# Patient Record
Sex: Female | Born: 2000 | Race: White | Hispanic: No | Marital: Single | Smoking: Never smoker
Health system: Southern US, Community
[De-identification: ages and names within clinical notes are randomized; demographics above are authoritative.]

## PROBLEM LIST (undated history)

## (undated) HISTORY — PX: TONSILLECTOMY: SUR1361

## (undated) HISTORY — PX: WISDOM TOOTH EXTRACTION: SHX21

---
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
## 2020-02-23 ENCOUNTER — Telehealth: Payer: Self-pay

## 2020-02-23 NOTE — Telephone Encounter (Signed)
Patient called because she went back to school without her pills and missed a whole week of pills. She waited for two weeks for her next period to start and when It did she took the first pill of a new pack on the first day she bled. That was a week ago and now x 3 days she has seen bleeding off and on.  She is now sexually active but was not the week she missed pills and I recommended she use back up birth control this pack and take pill every day/same time every day.  I reassured her that this is like starting over and starting pill for first time and the most common side affect is BTB.  I reassured her but told her I would check with her backup MD and see what they recommended.

## 2020-02-27 NOTE — Telephone Encounter (Signed)
Spoke with patient and informed her. °

## 2020-02-27 NOTE — Telephone Encounter (Signed)
Yes, I completely agree with what you told her. Thanks!

## 2020-07-05 ENCOUNTER — Ambulatory Visit: Payer: 59 | Admitting: Nurse Practitioner

## 2020-07-05 ENCOUNTER — Other Ambulatory Visit: Payer: Self-pay

## 2020-07-05 ENCOUNTER — Encounter: Payer: 59 | Admitting: Nurse Practitioner

## 2020-07-05 ENCOUNTER — Encounter: Payer: Self-pay | Admitting: Nurse Practitioner

## 2020-07-05 VITALS — BP 124/76 | Ht 66.0 in | Wt 216.0 lb

## 2020-07-05 DIAGNOSIS — Z01419 Encounter for gynecological examination (general) (routine) without abnormal findings: Secondary | ICD-10-CM | POA: Diagnosis not present

## 2020-07-05 DIAGNOSIS — Z3041 Encounter for surveillance of contraceptive pills: Secondary | ICD-10-CM | POA: Diagnosis not present

## 2020-07-05 MED ORDER — NORETHIN ACE-ETH ESTRAD-FE 1-20 MG-MCG PO TABS: 1.0000 | ORAL_TABLET | Freq: Every day | ORAL | 3 refills | Status: DC

## 2020-07-05 NOTE — Patient Instructions (Signed)
Health Maintenance, Female Adopting a healthy lifestyle and getting preventive care are important in promoting health and wellness. Ask your health care provider about:  The right schedule for you to have regular tests and exams.  Things you can do on your own to prevent diseases and keep yourself healthy. What should I know about diet, weight, and exercise? Eat a healthy diet   Eat a diet that includes plenty of vegetables, fruits, low-fat dairy products, and lean protein.  Do not eat a lot of foods that are high in solid fats, added sugars, or sodium. Maintain a healthy weight Body mass index (BMI) is used to identify weight problems. It estimates body fat based on height and weight. Your health care provider can help determine your BMI and help you achieve or maintain a healthy weight. Get regular exercise Get regular exercise. This is one of the most important things you can do for your health. Most adults should:  Exercise for at least 150 minutes each week. The exercise should increase your heart rate and make you sweat (moderate-intensity exercise).  Do strengthening exercises at least twice a week. This is in addition to the moderate-intensity exercise.  Spend less time sitting. Even light physical activity can be beneficial. Watch cholesterol and blood lipids Have your blood tested for lipids and cholesterol at 19 years of age, then have this test every 5 years. Have your cholesterol levels checked more often if:  Your lipid or cholesterol levels are high.  You are older than 19 years of age.  You are at high risk for heart disease. What should I know about cancer screening? Depending on your health history and family history, you may need to have cancer screening at various ages. This may include screening for:  Breast cancer.  Cervical cancer.  Colorectal cancer.  Skin cancer.  Lung cancer. What should I know about heart disease, diabetes, and high blood  pressure? Blood pressure and heart disease  High blood pressure causes heart disease and increases the risk of stroke. This is more likely to develop in people who have high blood pressure readings, are of African descent, or are overweight.  Have your blood pressure checked: ? Every 3-5 years if you are 19-39 years of age. ? Every year if you are 40 years old or older. Diabetes Have regular diabetes screenings. This checks your fasting blood sugar level. Have the screening done:  Once every three years after age 40 if you are at a normal weight and have a low risk for diabetes.  More often and at a younger age if you are overweight or have a high risk for diabetes. What should I know about preventing infection? Hepatitis B If you have a higher risk for hepatitis B, you should be screened for this virus. Talk with your health care provider to find out if you are at risk for hepatitis B infection. Hepatitis C Testing is recommended for:  Everyone born from 1945 through 1965.  Anyone with known risk factors for hepatitis C. Sexually transmitted infections (STIs)  Get screened for STIs, including gonorrhea and chlamydia, if: ? You are sexually active and are younger than 19 years of age. ? You are older than 19 years of age and your health care provider tells you that you are at risk for this type of infection. ? Your sexual activity has changed since you were last screened, and you are at increased risk for chlamydia or gonorrhea. Ask your health care provider if   you are at risk.  Ask your health care provider about whether you are at high risk for HIV. Your health care provider may recommend a prescription medicine to help prevent HIV infection. If you choose to take medicine to prevent HIV, you should first get tested for HIV. You should then be tested every 3 months for as long as you are taking the medicine. Pregnancy  If you are about to stop having your period (premenopausal) and  you may become pregnant, seek counseling before you get pregnant.  Take 400 to 800 micrograms (mcg) of folic acid every day if you become pregnant.  Ask for birth control (contraception) if you want to prevent pregnancy. Osteoporosis and menopause Osteoporosis is a disease in which the bones lose minerals and strength with aging. This can result in bone fractures. If you are 65 years old or older, or if you are at risk for osteoporosis and fractures, ask your health care provider if you should:  Be screened for bone loss.  Take a calcium or vitamin D supplement to lower your risk of fractures.  Be given hormone replacement therapy (HRT) to treat symptoms of menopause. Follow these instructions at home: Lifestyle  Do not use any products that contain nicotine or tobacco, such as cigarettes, e-cigarettes, and chewing tobacco. If you need help quitting, ask your health care provider.  Do not use street drugs.  Do not share needles.  Ask your health care provider for help if you need support or information about quitting drugs. Alcohol use  Do not drink alcohol if: ? Your health care provider tells you not to drink. ? You are pregnant, may be pregnant, or are planning to become pregnant.  If you drink alcohol: ? Limit how much you use to 0-1 drink a day. ? Limit intake if you are breastfeeding.  Be aware of how much alcohol is in your drink. In the [place] one drink equals one 12 oz bottle of beer (355 mL), one 5 oz glass of wine (148 mL), or one 1 oz glass of hard liquor (44 mL). General instructions  Schedule regular health, dental, and eye exams.  Stay current with your vaccines.  Tell your health care provider if: ? You often feel depressed. ? You have ever been abused or do not feel safe at home. Summary  Adopting a healthy lifestyle and getting preventive care are important in promoting health and wellness.  Follow your health care provider's instructions about healthy  diet, exercising, and getting tested or screened for diseases.  Follow your health care provider's instructions on monitoring your cholesterol and blood pressure. This information is not intended to replace advice given to you by your health care provider. Make sure you discuss any questions you have with your health care provider. Document Revised: 11/27/2018 Document Reviewed: 11/27/2018 Elsevier Patient Education  2020 [employer]  

## 2020-07-05 NOTE — Progress Notes (Signed)
   Janetta Clune 04/02/2001 [id number]   History:  19 y.o. G0 presents for annual exam without GYN complaints. Regular monthly cycle on Loestrin. Sexually active with one female partner, denies need for STD screen. Unsure if she has had the Gardasil series.   Gynecologic History Patient's last menstrual period was 06/14/2020. Period Cycle (Days): 28 Period Duration (Days): 5 Period Pattern: Regular Menstrual Flow: Moderate Dysmenorrhea: None Contraception: OCP (estrogen/progesterone)   Past medical history, past surgical history, family history and social history were all reviewed and documented in the EPIC chart.  ROS:  A ROS was performed and pertinent positives and negatives are included.  Exam:  Vitals:   07/05/20 1109  BP: 124/76  Weight: 216 lb (98 kg)  Height: 5\' 6"  (1.676 m)   Body mass index is 34.86 kg/m.  General appearance:  Normal Thyroid:  Symmetrical, normal in size, without palpable masses or nodularity. Respiratory  Auscultation:  Clear without wheezing or rhonchi Cardiovascular  Auscultation:  Regular rate, without rubs, murmurs or gallops  Edema/varicosities:  Not grossly evident Abdominal  Soft,nontender, without masses, guarding or rebound.  Liver/spleen:  No organomegaly noted  Hernia:  None appreciated  Skin  Inspection:  Grossly normal   Breasts: Examined lying and sitting.   Right: Without masses, retractions, discharge or axillary adenopathy.   Left: Without masses, retractions, discharge or axillary adenopathy. Gentitourinary   Inguinal/mons:  Normal without inguinal adenopathy  External genitalia:  Normal  BUS/Urethra/Skene's glands:  Normal  Vagina:  Normal  Cervix:  Normal  Uterus:  Anteverted, normal in size, shape and contour.  Midline and mobile  Adnexa/parametria:     Rt: Without masses or tenderness.   Lt: Without masses or tenderness.  Anus and perineum: Normal  Assessment/Plan:  19 y.o. G0 for annual exam.   Well female  exam with routine gynecological exam - Education provided on SBEs, importance of preventative screenings, current guidelines, high calcium diet, regular exercise, campus safety, safe sex and condom use until permanent partner,  and multivitamin daily.   Encounter for surveillance of contraceptive pills - Plan: norethindrone-ethinyl estradiol (LOESTRIN FE) 1-20 MG-MCG tablet. Refill x1 year provided.  Follow up in 1 year for annual      12 [facility], 11:25 AM 07/05/2020

## 2020-12-22 ENCOUNTER — Other Ambulatory Visit: Payer: 59

## 2021-05-02 ENCOUNTER — Other Ambulatory Visit: Payer: Self-pay | Admitting: Nurse Practitioner

## 2021-05-02 DIAGNOSIS — Z3041 Encounter for surveillance of contraceptive pills: Secondary | ICD-10-CM

## 2021-05-03 NOTE — Telephone Encounter (Signed)
Annual exam scheduled on 07/07/21

## 2021-07-06 NOTE — Progress Notes (Signed)
   Casey Lane March 08, 2001 [id number]   History:  20 y.o. G0 presents for annual exam without GYN complaints. Regular monthly cycle on OCPs. She has had one dose of Gardasil in 2013 and is interested in full vaccination. Not currently sexually active.  Gynecologic History Patient's last menstrual period was 06/29/2021. Period Cycle (Days): 28 Period Duration (Days): 5 Period Pattern: Regular Menstrual Flow: Moderate Dysmenorrhea: None Contraception: OCP (estrogen/progesterone)   Past medical history, past surgical history, family history and social history were all reviewed and documented in the EPIC chart. In nursing school. Graduates in 2024.   ROS:  A ROS was performed and pertinent positives and negatives are included.  Exam:  Vitals:   07/07/21 0957  BP: 116/76  Weight: 215 lb (97.5 kg)  Height: 5\' 6"  (1.676 m)    Body mass index is 34.7 kg/m.  General appearance:  Normal Thyroid:  Symmetrical, normal in size, without palpable masses or nodularity. Respiratory  Auscultation:  Clear without wheezing or rhonchi Cardiovascular  Auscultation:  Regular rate, without rubs, murmurs or gallops  Edema/varicosities:  Not grossly evident Abdominal  Soft,nontender, without masses, guarding or rebound.  Liver/spleen:  No organomegaly noted  Hernia:  None appreciated  Skin  Inspection:  Grossly normal   Breasts: Not indicated Gentitourinary Not indicated  Assessment/Plan:  20 y.o. G0 for annual exam.   Well female exam without gynecological exam - Education provided on SBEs, importance of preventative screenings, current guidelines, high calcium diet, regular exercise, and multivitamin daily.   Encounter for surveillance of contraceptive pills - Plan: norethindrone-ethinyl estradiol-FE (JUNEL FE 1/20) 1-20 MG-MCG tablet daily. Taking as prescribed. Refill x 1 year provided.  Need for HPV vaccination - Plan: HPV 9-valent vaccine,Recombinat today. Will return in 2 months for  second dose. 3-dose series recommended.    Follow up in 1 year for annual      2/20 [facility], 11:01 AM 07/07/2021

## 2021-07-07 ENCOUNTER — Ambulatory Visit (INDEPENDENT_AMBULATORY_CARE_PROVIDER_SITE_OTHER): Payer: 59 | Admitting: Nurse Practitioner

## 2021-07-07 ENCOUNTER — Other Ambulatory Visit: Payer: Self-pay

## 2021-07-07 ENCOUNTER — Encounter: Payer: Self-pay | Admitting: Nurse Practitioner

## 2021-07-07 VITALS — BP 116/76 | Ht 66.0 in | Wt 215.0 lb

## 2021-07-07 DIAGNOSIS — Z23 Encounter for immunization: Secondary | ICD-10-CM

## 2021-07-07 DIAGNOSIS — Z0000 Encounter for general adult medical examination without abnormal findings: Secondary | ICD-10-CM | POA: Diagnosis not present

## 2021-07-07 DIAGNOSIS — Z3041 Encounter for surveillance of contraceptive pills: Secondary | ICD-10-CM | POA: Diagnosis not present

## 2021-07-07 MED ORDER — NORETHIN ACE-ETH ESTRAD-FE 1-20 MG-MCG PO TABS: 1.0000 | ORAL_TABLET | Freq: Every day | ORAL | 3 refills | Status: DC

## 2022-05-26 ENCOUNTER — Other Ambulatory Visit (HOSPITAL_COMMUNITY)
Admission: RE | Admit: 2022-05-26 | Discharge: 2022-05-26 | Disposition: A | Discharge status: Home / Self Care | Payer: 59 | Source: Ambulatory Visit | Attending: Nurse Practitioner | Admitting: Nurse Practitioner

## 2022-05-26 ENCOUNTER — Encounter: Payer: Self-pay | Admitting: Nurse Practitioner

## 2022-05-26 ENCOUNTER — Ambulatory Visit (INDEPENDENT_AMBULATORY_CARE_PROVIDER_SITE_OTHER): Payer: 59 | Admitting: Nurse Practitioner

## 2022-05-26 VITALS — BP 118/76 | Ht 65.0 in | Wt 237.0 lb

## 2022-05-26 DIAGNOSIS — Z113 Encounter for screening for infections with a predominantly sexual mode of transmission: Secondary | ICD-10-CM | POA: Diagnosis present

## 2022-05-26 DIAGNOSIS — Z01419 Encounter for gynecological examination (general) (routine) without abnormal findings: Secondary | ICD-10-CM | POA: Insufficient documentation

## 2022-05-26 DIAGNOSIS — Z3041 Encounter for surveillance of contraceptive pills: Secondary | ICD-10-CM

## 2022-05-26 MED ORDER — NORETHIN ACE-ETH ESTRAD-FE 1-20 MG-MCG PO TABS: 1.0000 | ORAL_TABLET | Freq: Every day | ORAL | 3 refills | Status: DC

## 2022-05-26 NOTE — Progress Notes (Signed)
   Casey Lane 2001-09-15 [id number]   History:  21 y.o. G0 presents for annual exam without GYN complaints. Regular monthly cycle on OCPs. First dose of Gardasil in 2013, received second last year.   Gynecologic History Patient's last menstrual period was 05/07/2022. Period Cycle (Days): 28 Period Duration (Days): 5 Period Pattern: Regular Menstrual Flow: Moderate Dysmenorrhea: None Contraception/Family planning: OCP (estrogen/progesterone) Sexually active: Yes  Health Maintenance Last Pap: Never Last mammogram: Not indicated Last colonoscopy: Not indicated Last Dexa: Not indicated   Past medical history, past surgical history, family history and social history were all reviewed and documented in the EPIC chart. 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   ROS:  A ROS was performed and pertinent positives and negatives are included.  Exam:  Vitals:   05/26/22 1414  BP: 118/76  Weight: 237 lb (107.5 kg)  Height: 5\' 5"  (1.651 m)     Body mass index is 39.44 kg/m.  General appearance:  Normal Thyroid:  Symmetrical, normal in size, without palpable masses or nodularity. Respiratory  Auscultation:  Clear without wheezing or rhonchi Cardiovascular  Auscultation:  Regular rate, without rubs, murmurs or gallops  Edema/varicosities:  Not grossly evident Abdominal  Soft,nontender, without masses, guarding or rebound.  Liver/spleen:  No organomegaly noted  Hernia:  None appreciated  Skin  Inspection:  Grossly normal Breasts: Not indicated per guidelines Genitourinary   Inguinal/mons:  Normal without inguinal adenopathy  External genitalia:  Normal appearing vulva with no masses, tenderness, or lesions  BUS/Urethra/Skene's glands:  Normal  Vagina:  Normal appearing with normal color and discharge, no lesions  Cervix:  Normal appearing without discharge or lesions  Uterus:  Normal in size, shape and contour.  Midline  and mobile, nontender  Adnexa/parametria:     Rt: Normal in size, without masses or tenderness.   Lt: Normal in size, without masses or tenderness.  Anus and perineum: Normal  Digital rectal exam: Not indicated  Patient informed chaperone available to be present for breast and pelvic exam. Patient has requested no chaperone to be present. Patient has been advised what will be completed during breast and pelvic exam.   Assessment/Plan:  21 y.o. G0 for annual exam.   Well female exam with routine gynecological exam - Plan: Cytology - PAP( [facility]). Education provided on SBEs, importance of preventative screenings, current guidelines, high calcium diet, regular exercise, and multivitamin daily.   Encounter for surveillance of contraceptive pills - Plan: norethindrone-ethinyl estradiol-FE (JUNEL FE 1/20) 1-20 MG-MCG tablet daily. Taking as prescribed. Refill x 1 year provided.   Screen for STD (sexually transmitted disease) - Plan: Cytology - PAP( [facility]), RPR, HIV Antibody (routine testing w rflx). GC, chlamydia, trich added to pap.   Screening for cervical cancer - Initial pap today.   Follow up in 1 year for annual.      2/20 [facility], 2:41 PM 05/26/2022

## 2022-05-29 LAB — RPR: RPR Ser Ql: NONREACTIVE

## 2022-05-29 LAB — HIV ANTIBODY (ROUTINE TESTING W REFLEX): HIV 1&2 Ab, 4th Generation: NONREACTIVE

## 2022-05-30 LAB — CYTOLOGY - PAP
Adequacy: ABSENT
Chlamydia: NEGATIVE
Comment: NEGATIVE
Comment: NEGATIVE
Comment: NORMAL
Diagnosis: UNDETERMINED — AB
Neisseria Gonorrhea: NEGATIVE
Trichomonas: NEGATIVE

## 2022-11-13 ENCOUNTER — Telehealth: Payer: Self-pay | Admitting: *Deleted

## 2022-11-13 NOTE — Telephone Encounter (Signed)
Patient called c/o no cycle this month reports LMP week of 10/02/22, 2 negative UPT. Reports clear discharge, no odor or itching. Takes pills daily on time, no missed pills. Patient reports this never happened before. Please advise

## 2022-11-13 NOTE — Telephone Encounter (Signed)
I would continue to monitor. Not unusual to not have menses on COCs. May just be an off month.

## 2022-11-13 NOTE — Telephone Encounter (Signed)
Patient informed. 

## 2023-04-13 ENCOUNTER — Other Ambulatory Visit: Payer: Self-pay | Admitting: Nurse Practitioner

## 2023-04-13 DIAGNOSIS — Z3041 Encounter for surveillance of contraceptive pills: Secondary | ICD-10-CM

## 2023-04-17 NOTE — Telephone Encounter (Signed)
Medication refill request: junel fe  Last AEX:  05/26/22 Next AEX: none scheduled  Last MMG (if hormonal medication request): n/a Refill authorized: #[id number] with 0 rf

## 2023-06-26 ENCOUNTER — Other Ambulatory Visit: Payer: Self-pay | Admitting: Nurse Practitioner

## 2023-06-26 DIAGNOSIS — Z3041 Encounter for surveillance of contraceptive pills: Secondary | ICD-10-CM

## 2023-06-27 NOTE — Telephone Encounter (Signed)
Medication refill request: Junel fe  Last AEX:  05/27/23 Next AEX: not scheduled  Last MMG (if hormonal medication request): n/a Refill authorized: refill refused for today. Patient needs to schedule yearly appointment.

## 2023-10-25 ENCOUNTER — Encounter: Payer: Self-pay | Admitting: Neurology

## 2024-01-31 ENCOUNTER — Ambulatory Visit: Payer: 59 | Admitting: Neurology

## 2024-06-16 ENCOUNTER — Encounter (HOSPITAL_COMMUNITY): Payer: Self-pay

## 2024-06-16 ENCOUNTER — Emergency Department (HOSPITAL_COMMUNITY)

## 2024-06-16 ENCOUNTER — Emergency Department (HOSPITAL_COMMUNITY): Admission: EM | Admit: 2024-06-16 | Discharge: 2024-06-16 | Disposition: A | Discharge status: Home / Self Care

## 2024-06-16 ENCOUNTER — Other Ambulatory Visit: Payer: Self-pay

## 2024-06-16 DIAGNOSIS — K1120 Sialoadenitis, unspecified: Secondary | ICD-10-CM | POA: Diagnosis not present

## 2024-06-16 DIAGNOSIS — M542 Cervicalgia: Secondary | ICD-10-CM | POA: Diagnosis present

## 2024-06-16 LAB — BASIC METABOLIC PANEL WITH GFR
Anion gap: 12 (ref 5–15)
BUN: 14 mg/dL (ref 6–20)
CO2: 23 mmol/L (ref 22–32)
Calcium: 8.4 mg/dL — ABNORMAL LOW (ref 8.9–10.3)
Chloride: 102 mmol/L (ref 98–111)
Creatinine, Ser: 0.73 mg/dL (ref 0.44–1.00)
GFR, Estimated: >60 mL/min (ref >60)
Glucose, Bld: 91 mg/dL (ref 70–99)
Potassium: 3.8 mmol/L (ref 3.5–5.1)
Sodium: 137 mmol/L (ref 135–145)

## 2024-06-16 LAB — CBC WITH DIFFERENTIAL/PLATELET
Abs Immature Granulocytes: 0.01 10*3/uL (ref 0.00–0.07)
Basophils Absolute: 0 10*3/uL (ref 0.0–0.1)
Basophils Relative: 1 %
Eosinophils Absolute: 0.1 10*3/uL (ref 0.0–0.5)
Eosinophils Relative: 1 %
HCT: 40.5 % (ref 36.0–46.0)
Hemoglobin: 13.4 g/dL (ref 12.0–15.0)
Immature Granulocytes: 0 %
Lymphocytes Relative: 21 %
Lymphs Abs: 1.2 10*3/uL (ref 0.7–4.0)
MCH: 30 pg (ref 26.0–34.0)
MCHC: 33.1 g/dL (ref 30.0–36.0)
MCV: 90.8 fL (ref 80.0–100.0)
Monocytes Absolute: 0.6 10*3/uL (ref 0.1–1.0)
Monocytes Relative: 11 %
Neutro Abs: 3.8 10*3/uL (ref 1.7–7.7)
Neutrophils Relative %: 66 %
Platelets: 167 10*3/uL (ref 150–400)
RBC: 4.46 MIL/uL (ref 3.87–5.11)
RDW: 12.8 % (ref 11.5–15.5)
WBC: 5.7 10*3/uL (ref 4.0–10.5)
nRBC: 0 % (ref 0.0–0.2)

## 2024-06-16 LAB — POC URINE PREG, ED: Preg Test, Ur: NEGATIVE

## 2024-06-16 MED ORDER — CLINDAMYCIN HCL 150 MG PO CAPS: 450.0000 mg | ORAL_CAPSULE | Freq: Three times a day (TID) | ORAL | 0 refills | Status: AC

## 2024-06-16 MED ORDER — KETOROLAC TROMETHAMINE 15 MG/ML IJ SOLN
15.0000 mg | Freq: Once | INTRAMUSCULAR | Status: AC
  Administered 2024-06-16: 15 mg via INTRAVENOUS
  Filled 2024-06-16: qty 1

## 2024-06-16 MED ORDER — IOHEXOL 300 MG/ML  SOLN
75.0000 mL | Freq: Once | INTRAMUSCULAR | Status: AC | PRN
  Administered 2024-06-16: 75 mL via INTRAVENOUS

## 2024-06-16 NOTE — Discharge Instructions (Addendum)
 Follow-up with your primary care doctor in 1 week to ensure improvement of your symptoms.  Return to the ER for any new or worsening symptoms.  You can use Tylenol Motrin as needed for pain.  Take your antibiotics as prescribed.

## 2024-06-16 NOTE — ED Provider Notes (Signed)
 [place] EMERGENCY DEPARTMENT AT [facility] Provider Note   CSN: [id number] Arrival date & time: 06/16/24  1119     Patient presents with: Neck Pain   Casey Lane is a 23 y.o. female.   23 year old female presents for evaluation of right-sided neck pain.  States it started on Friday.  She initially just noticed some swelling and pain on her upper right neck.  Denies any falls or trauma.  States she was seen at urgent care and told to start Augmentin if she developed a fever.  States she did not start it because she did not develop a fever.  She states today has become more red more swollen and more painful now pain is rating to her head jaw and ear.  Denies any throat pain or difficulty swallowing.  She has been taking Tylenol Motrin for pain that she states has been helping.  Denies any other symptoms or concerns at this time.   Neck Pain Associated symptoms: no chest pain and no fever        Prior to Admission medications   Medication Sig Start Date End Date Taking? Authorizing Provider  clindamycin (CLEOCIN) 150 MG capsule Take 3 capsules (450 mg total) by mouth 3 (three) times daily for 10 days. 06/16/24 06/26/24 Yes Tyeesha Riker L, DO  norethindrone-ethinyl estradiol-FE (JUNEL FE 1/20) 1-20 MG-MCG tablet TAKE 1 TABLET BY MOUTH DAILY 04/17/23   Prentiss Riggs A, NP    Allergies: Neosporin [neomycin-bacitracin zn-polymyx]    Review of Systems  Constitutional:  Negative for chills and fever.  HENT:  Negative for ear pain and sore throat.   Eyes:  Negative for pain and visual disturbance.  Respiratory:  Negative for cough and shortness of breath.   Cardiovascular:  Negative for chest pain and palpitations.  Gastrointestinal:  Negative for abdominal pain and vomiting.  Genitourinary:  Negative for dysuria and hematuria.  Musculoskeletal:  Positive for neck pain. Negative for arthralgias and back pain.  Skin:  Negative for color change and rash.  Neurological:   Negative for seizures and syncope.  All other systems reviewed and are negative.   Updated Vital Signs BP 125/80 (BP Location: Left Arm)   Pulse 81   Temp 98.9 F (37.2 C) (Oral)   Resp 16   Ht 5' 5 (1.651 m)   Wt 107.5 kg   LMP 05/16/2024 (Exact Date)   SpO2 99%   BMI 39.44 kg/m   Physical Exam Vitals and nursing note reviewed.  Constitutional:      General: She is not in acute distress.    Appearance: She is well-developed. She is not ill-appearing.  HENT:     Head: Normocephalic and atraumatic.     Right Ear: Tympanic membrane normal.     Left Ear: Tympanic membrane normal.     Nose: Nose normal.     Mouth/Throat:     Mouth: Mucous membranes are moist.     Pharynx: Oropharynx is clear. No oropharyngeal exudate or posterior oropharyngeal erythema.   Eyes:     Conjunctiva/sclera: Conjunctivae normal.   Neck:     Comments: Right anterior neck with erythematous area just under the ear with same pain with palpation and tender enlarged right lymph node Cardiovascular:     Rate and Rhythm: Normal rate and regular rhythm.     Heart sounds: No murmur heard. Pulmonary:     Effort: Pulmonary effort is normal. No respiratory distress.     Breath sounds: Normal breath sounds.  Abdominal:     Palpations: Abdomen is soft.     Tenderness: There is no abdominal tenderness.   Musculoskeletal:        General: No swelling.     Cervical back: Neck supple.   Skin:    General: Skin is warm and dry.     Capillary Refill: Capillary refill takes less than 2 seconds.   Neurological:     Mental Status: She is alert.   Psychiatric:        Mood and Affect: Mood normal.     (all labs ordered are listed, but only abnormal results are displayed) Labs Reviewed  BASIC METABOLIC PANEL WITH GFR - Abnormal; Notable for the following components:      Result Value   Calcium 8.4 (*)    All other components within normal limits  CBC WITH DIFFERENTIAL/PLATELET  POC URINE PREG, ED     EKG: None  Radiology: CT Soft Tissue Neck W Contrast Result Date: 06/16/2024 CLINICAL DATA:  swollen right side of neck with redness tenderness just under ear EXAM: CT NECK WITH CONTRAST TECHNIQUE: Multidetector CT imaging of the neck was performed using the standard protocol following the bolus administration of intravenous contrast. RADIATION DOSE REDUCTION: This exam was performed according to the departmental dose-optimization program which includes automated exposure control, adjustment of the mA and/or kV according to patient size and/or use of iterative reconstruction technique. CONTRAST:  75mL OMNIPAQUE IOHEXOL 300 MG/ML  SOLN COMPARISON:  None. FINDINGS: Pharynx and larynx: The soft tissues of the oral cavity, pharynx and larynx are unremarkable. There is no evidence of tonsillitis or peritonsillar abscess. Salivary glands: The right parotid is mildly swollen and there is soft tissue stranding of the adjacent fat. The left parotid gland and both submandibular glands are unremarkable. Thyroid: Normal. Lymph nodes: There is mild right level 1 and level 2 lymphadenopathy, with the largest node measuring approximately 2.1 x 1.6 cm on the axial images. There are few shotty left cervical lymph nodes. Vascular: Normal. Limited intracranial: Normal. Visualized orbits: Normal. Mastoids and visualized paranasal sinuses: Circumferential mucosal disease within the floor the right maxillary sinus. The mastoid air cells are clear. Skeleton: Negative. Upper chest: The lung apices are clear. Other: None. IMPRESSION: 1. Right parotiditis with mild adjacent soft tissue stranding. 2. Right cervical lymphadenopathy, likely reactive. Recommend follow-up as clinically warranted. Electronically Signed   By: Evalene Coho M.D.   On: 06/16/2024 16:11     Procedures   Medications Ordered in the ED  ketorolac (TORADOL) 15 MG/ML injection 15 mg (15 mg Intravenous Given 06/16/24 1344)  iohexol (OMNIPAQUE) 300  MG/ML solution 75 mL (75 mLs Intravenous Contrast Given 06/16/24 1508)                                    Medical Decision Making Medical Decision Making Nursing notes are reviewed. Differential diagnosis for this patient would include but not limited to: cellulitis, lymphadenitis, mastoiditis, parotitis, abscess, other   Records reviewed: Urgent care records reviewed and patient was prescribed Augmentin on Friday  Studies: CT soft tissues neck reviewed and patient has evidence of swollen lymph nodes as well as parotitis but no other acute abnormality  Emergency Department Course:  Vital signs and pulse oximetry are reviewed, evaluated by myself and found to be within normal limits prior to final disposition. Findings of laboratory testing and medical imaging are discussed with patient and family  that is available. Patient agrees with the medical care plan as follows:  Lab workup reviewed by me and unremarkable.  Imaging as above.  She appears stable.  Was given Toradol.  I will give her prescription for clindamycin.  She is advised use Tylenol Motrin as needed for pain and obtain close follow-up with primary care.  Advised to return to the ER for any worsening symptoms.  She was comfortable to plan be discharged home.  Problems Addressed: Parotitis: acute illness or injury  Amount and/or Complexity of Data Reviewed External Data Reviewed: notes. Labs: ordered. Decision-making details documented in ED Course. Radiology: ordered and independent interpretation performed. Decision-making details documented in ED Course.  Risk OTC drugs. Prescription drug management.    Final diagnoses:  Parotitis    ED Discharge Orders          Ordered    clindamycin (CLEOCIN) 150 MG capsule  3 times daily        06/16/24 [address], Deontay Ladnier L, DO 06/16/24 1642

## 2024-06-16 NOTE — ED Triage Notes (Signed)
 Pt arrived via POV from home c/o right neck pain/swelling and a pounding headache since Friday morning. Pt reports going to urgent care and being prescribed Augmentin, but Pt reports since she is not febrile, she has not started the antibiotic.

## 2024-06-17 ENCOUNTER — Encounter (HOSPITAL_COMMUNITY): Payer: Self-pay | Admitting: Emergency Medicine

## 2024-06-17 ENCOUNTER — Other Ambulatory Visit: Payer: Self-pay

## 2024-06-17 ENCOUNTER — Emergency Department (HOSPITAL_COMMUNITY)
Admission: EM | Admit: 2024-06-17 | Discharge: 2024-06-17 | Disposition: A | Discharge status: Home / Self Care | Attending: Emergency Medicine | Admitting: Emergency Medicine

## 2024-06-17 DIAGNOSIS — R220 Localized swelling, mass and lump, head: Secondary | ICD-10-CM | POA: Diagnosis present

## 2024-06-17 DIAGNOSIS — K1120 Sialoadenitis, unspecified: Secondary | ICD-10-CM | POA: Insufficient documentation

## 2024-06-17 LAB — CBC WITH DIFFERENTIAL/PLATELET
Abs Immature Granulocytes: 0.03 10*3/uL (ref 0.00–0.07)
Basophils Absolute: 0.1 10*3/uL (ref 0.0–0.1)
Basophils Relative: 1 %
Eosinophils Absolute: 0.1 10*3/uL (ref 0.0–0.5)
Eosinophils Relative: 1 %
HCT: 38.1 % (ref 36.0–46.0)
Hemoglobin: 12.8 g/dL (ref 12.0–15.0)
Immature Granulocytes: 0 %
Lymphocytes Relative: 11 %
Lymphs Abs: 1 10*3/uL (ref 0.7–4.0)
MCH: 30.1 pg (ref 26.0–34.0)
MCHC: 33.6 g/dL (ref 30.0–36.0)
MCV: 89.6 fL (ref 80.0–100.0)
Monocytes Absolute: 0.8 10*3/uL (ref 0.1–1.0)
Monocytes Relative: 8 %
Neutro Abs: 7.7 10*3/uL (ref 1.7–7.7)
Neutrophils Relative %: 79 %
Platelets: 147 10*3/uL — ABNORMAL LOW (ref 150–400)
RBC: 4.25 MIL/uL (ref 3.87–5.11)
RDW: 12.9 % (ref 11.5–15.5)
WBC: 9.6 10*3/uL (ref 4.0–10.5)
nRBC: 0 % (ref 0.0–0.2)

## 2024-06-17 LAB — COMPREHENSIVE METABOLIC PANEL WITH GFR
ALT: 19 U/L (ref 0–44)
AST: 30 U/L (ref 15–41)
Albumin: 3.4 g/dL — ABNORMAL LOW (ref 3.5–5.0)
Alkaline Phosphatase: 64 U/L (ref 38–126)
Anion gap: 10 (ref 5–15)
BUN: 10 mg/dL (ref 6–20)
CO2: 22 mmol/L (ref 22–32)
Calcium: 8.4 mg/dL — ABNORMAL LOW (ref 8.9–10.3)
Chloride: 104 mmol/L (ref 98–111)
Creatinine, Ser: 0.66 mg/dL (ref 0.44–1.00)
GFR, Estimated: >60 mL/min (ref >60)
Glucose, Bld: 105 mg/dL — ABNORMAL HIGH (ref 70–99)
Potassium: 4.3 mmol/L (ref 3.5–5.1)
Sodium: 136 mmol/L (ref 135–145)
Total Bilirubin: 1.3 mg/dL — ABNORMAL HIGH (ref 0.0–1.2)
Total Protein: 6.7 g/dL (ref 6.5–8.1)

## 2024-06-17 MED ORDER — HYDROMORPHONE HCL 1 MG/ML IJ SOLN
0.5000 mg | Freq: Once | INTRAMUSCULAR | Status: AC
  Administered 2024-06-17: 0.5 mg via INTRAVENOUS
  Filled 2024-06-17: qty 0.5

## 2024-06-17 MED ORDER — OXYCODONE-ACETAMINOPHEN 5-325 MG PO TABS: 1.0000 | ORAL_TABLET | Freq: Four times a day (QID) | ORAL | 0 refills | Status: AC | PRN

## 2024-06-17 NOTE — Discharge Instructions (Signed)
 Continue taking your clindamycin and follow-up Monday as planned with your doctor

## 2024-06-17 NOTE — ED Triage Notes (Signed)
 Pt to ed pov c/o right sided jaw pain/swelling that extends towards the back of the head. Pt was seen in this ER yesterday for the same. Pt states swelling and pain has gotten worse. Pt states has had a fever as high as 101.4. pt last took 1000mg  tylenol at 1815. Last took ibuprofen 400mg  at 1700

## 2024-06-18 NOTE — ED Provider Notes (Signed)
 [place] EMERGENCY DEPARTMENT AT [facility] Provider Note   CSN: [id number] Arrival date & time: 06/17/24  1945     Patient presents with: Facial Swelling   Casey Lane is a 23 y.o. female.   Patient has right-sided neck pain and had a CT scan yesterday that showed right parotiditis.  Patient is on clindamycin but says the pain is getting worse  The history is provided by the patient and medical records. No language interpreter was used.  Neck Injury This is a new problem. The current episode started 2 days ago. The problem occurs constantly. The problem has not changed since onset.Pertinent negatives include no chest pain, no abdominal pain and no headaches. Nothing aggravates the symptoms. Nothing relieves the symptoms. She has tried nothing for the symptoms.       Prior to Admission medications   Medication Sig Start Date End Date Taking? Authorizing Provider  oxyCODONE-acetaminophen (PERCOCET/ROXICET) 5-325 MG tablet Take 1 tablet by mouth every 6 (six) hours as needed for severe pain (pain score 7-10). 06/17/24  Yes Nichola Warren, MD  amoxicillin-clavulanate (AUGMENTIN) 875-125 MG tablet Take 1 tablet by mouth 2 (two) times daily. 06/14/24   [provider]  clindamycin (CLEOCIN) 150 MG capsule Take 3 capsules (450 mg total) by mouth 3 (three) times daily for 10 days. 06/16/24 06/26/24  Kammerer, Megan L, DO  fluticasone (FLONASE) 50 MCG/ACT nasal spray Place 2 sprays into both nostrils daily. 04/28/24   [provider]  norethindrone-ethinyl estradiol-FE (JUNEL FE 1/20) 1-20 MG-MCG tablet TAKE 1 TABLET BY MOUTH DAILY 04/17/23   Prentiss Riggs A, NP    Allergies: Neosporin [neomycin-bacitracin zn-polymyx]    Review of Systems  Constitutional:  Negative for appetite change and fatigue.  HENT:  Negative for congestion, ear discharge and sinus pressure.        Neck pain  Eyes:  Negative for discharge.  Respiratory:  Negative for cough.    Cardiovascular:  Negative for chest pain.  Gastrointestinal:  Negative for abdominal pain and diarrhea.  Genitourinary:  Negative for frequency and hematuria.  Musculoskeletal:  Negative for back pain.  Skin:  Negative for rash.  Neurological:  Negative for seizures and headaches.  Psychiatric/Behavioral:  Negative for hallucinations.     Updated Vital Signs BP 119/70   Pulse 80   Temp 98.1 F (36.7 C) (Oral)   Resp 16   Ht 5' 5 (1.651 m)   Wt 107.5 kg   LMP 06/17/2024 (Exact Date)   SpO2 99%   BMI 39.44 kg/m   Physical Exam Vitals and nursing note reviewed.  Constitutional:      Appearance: She is well-developed.  HENT:     Head: Normocephalic.     Comments: Tender swollen right side of neck    Nose: Nose normal.     Mouth/Throat:     Mouth: Mucous membranes are moist.  Eyes:     General: No scleral icterus.    Conjunctiva/sclera: Conjunctivae normal.  Neck:     Thyroid: No thyromegaly.  Cardiovascular:     Rate and Rhythm: Normal rate and regular rhythm.     Heart sounds: No murmur heard.    No friction rub. No gallop.  Pulmonary:     Breath sounds: No stridor. No wheezing or rales.  Chest:     Chest wall: No tenderness.  Abdominal:     General: There is no distension.     Tenderness: There is no abdominal tenderness. There is no rebound.  Musculoskeletal:        General: Normal range of motion.     Cervical back: Neck supple.  Lymphadenopathy:     Cervical: No cervical adenopathy.  Skin:    Findings: No erythema or rash.  Neurological:     Mental Status: She is alert and oriented to person, place, and time.     Motor: No abnormal muscle tone.     Coordination: Coordination normal.  Psychiatric:        Behavior: Behavior normal.     (all labs ordered are listed, but only abnormal results are displayed) Labs Reviewed  CBC WITH DIFFERENTIAL/PLATELET - Abnormal; Notable for the following components:      Result Value   Platelets 147 (*)    All  other components within normal limits  COMPREHENSIVE METABOLIC PANEL WITH GFR - Abnormal; Notable for the following components:   Glucose, Bld 105 (*)    Calcium 8.4 (*)    Albumin 3.4 (*)    Total Bilirubin 1.3 (*)    All other components within normal limits    EKG: None  Radiology: CT Soft Tissue Neck W Contrast Result Date: 06/16/2024 CLINICAL DATA:  swollen right side of neck with redness tenderness just under ear EXAM: CT NECK WITH CONTRAST TECHNIQUE: Multidetector CT imaging of the neck was performed using the standard protocol following the bolus administration of intravenous contrast. RADIATION DOSE REDUCTION: This exam was performed according to the departmental dose-optimization program which includes automated exposure control, adjustment of the mA and/or kV according to patient size and/or use of iterative reconstruction technique. CONTRAST:  75mL OMNIPAQUE IOHEXOL 300 MG/ML  SOLN COMPARISON:  None. FINDINGS: Pharynx and larynx: The soft tissues of the oral cavity, pharynx and larynx are unremarkable. There is no evidence of tonsillitis or peritonsillar abscess. Salivary glands: The right parotid is mildly swollen and there is soft tissue stranding of the adjacent fat. The left parotid gland and both submandibular glands are unremarkable. Thyroid: Normal. Lymph nodes: There is mild right level 1 and level 2 lymphadenopathy, with the largest node measuring approximately 2.1 x 1.6 cm on the axial images. There are few shotty left cervical lymph nodes. Vascular: Normal. Limited intracranial: Normal. Visualized orbits: Normal. Mastoids and visualized paranasal sinuses: Circumferential mucosal disease within the floor the right maxillary sinus. The mastoid air cells are clear. Skeleton: Negative. Upper chest: The lung apices are clear. Other: None. IMPRESSION: 1. Right parotiditis with mild adjacent soft tissue stranding. 2. Right cervical lymphadenopathy, likely reactive. Recommend follow-up  as clinically warranted. Electronically Signed   By: Evalene Coho M.D.   On: 06/16/2024 16:11     Procedures   Medications Ordered in the ED  HYDROmorphone (DILAUDID) injection 0.5 mg (0.5 mg Intravenous Given 06/17/24 2050)                                    Medical Decision Making Amount and/or Complexity of Data Reviewed Labs: ordered.  Risk Prescription drug management.   Patient with right-sided parotiditis.  She will continue the clindamycin and is given Percocet and will follow-up with her PCP     Final diagnoses:  Parotiditis    ED Discharge Orders          Ordered    oxyCODONE-acetaminophen (PERCOCET/ROXICET) 5-325 MG tablet  Every 6 hours PRN        06/17/24 2224  Suzette Pac, MD 06/18/24 1225

## 2024-10-15 ENCOUNTER — Other Ambulatory Visit (HOSPITAL_BASED_OUTPATIENT_CLINIC_OR_DEPARTMENT_OTHER): Payer: Self-pay

## 2024-10-15 MED ORDER — FLUZONE 0.5 ML IM SUSY
0.5000 mL | PREFILLED_SYRINGE | Freq: Once | INTRAMUSCULAR | 0 refills | Status: AC
  Filled 2024-10-15: qty 0.5, 1d supply, fill #0
